# Patient Record
Sex: Male | Born: 1985 | Race: White | Hispanic: No | Marital: Single | Smoking: Current every day smoker
Health system: Southern US, Community
[De-identification: ages and names within clinical notes are randomized; demographics above are authoritative.]

## PROBLEM LIST (undated history)

## (undated) HISTORY — PX: TONSILLECTOMY: SUR1361

---

## 2014-09-11 ENCOUNTER — Ambulatory Visit: Payer: 59

## 2014-09-11 ENCOUNTER — Ambulatory Visit
Admission: EM | Admit: 2014-09-11 | Discharge: 2014-09-11 | Disposition: A | Discharge status: Home / Self Care | Payer: 59 | Attending: Family Medicine | Admitting: Family Medicine

## 2014-09-11 DIAGNOSIS — F17210 Nicotine dependence, cigarettes, uncomplicated: Secondary | ICD-10-CM | POA: Diagnosis not present

## 2014-09-11 DIAGNOSIS — M25571 Pain in right ankle and joints of right foot: Secondary | ICD-10-CM | POA: Insufficient documentation

## 2014-09-11 DIAGNOSIS — S96912A Strain of unspecified muscle and tendon at ankle and foot level, left foot, initial encounter: Secondary | ICD-10-CM | POA: Diagnosis not present

## 2014-09-11 DIAGNOSIS — M778 Other enthesopathies, not elsewhere classified: Secondary | ICD-10-CM

## 2014-09-11 DIAGNOSIS — M779 Enthesopathy, unspecified: Secondary | ICD-10-CM

## 2014-09-11 MED ORDER — MELOXICAM 7.5 MG PO TABS: 7.5000 mg | ORAL_TABLET | Freq: Two times a day (BID) | ORAL | Status: AC

## 2014-09-11 MED ORDER — KETOROLAC TROMETHAMINE 60 MG/2ML IM SOLN
60.0000 mg | Freq: Once | INTRAMUSCULAR | Status: AC
  Administered 2014-09-11: 60 mg via INTRAMUSCULAR

## 2014-09-11 NOTE — ED Notes (Signed)
States right great toe pain x 2 1/2 weeks. Denies trauma. + redness outer toe. Dad recently dx with gout

## 2014-09-11 NOTE — ED Provider Notes (Signed)
CSN: [id number]     Arrival date & time 09/11/14  1600 History   First MD Initiated Contact with Patient 09/11/14 1803     Chief Complaint  Patient presents with  . Toe Pain   (Consider location/radiation/quality/duration/timing/severity/associated sxs/prior Treatment) HPI 2-3 week hx of pain in base of right great toe. 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 Started having deep pain in base of right toe not relieved by 1-2 tabs of OTC ibuprofen 1-2 times a days. Boots are soft and flexible, allowing repeated flexes of area- denies any knowledge of trauma . No recognized stone bruise. Does climb a lot of ladders History reviewed. No pertinent past medical history. Past Surgical History  Procedure Laterality Date  . Tonsillectomy     Family History  Problem Relation Age of Onset  . Cancer Father   . Diabetes Father   . Hypertension Father    History  Substance Use Topics  . Smoking status: Current Every Day Smoker  . Smokeless tobacco: Current User    Types: Chew  . Alcohol Use: Yes     Comment: rarely    Review of Systems  All other systems reviewed and are negative.   Allergies  Review of patient's allergies indicates no known allergies.  Home Medications   Prior to Admission medications   Medication Sig Start Date End Date Taking? Authorizing Provider  ibuprofen (ADVIL,MOTRIN) 200 MG tablet Take 200 mg by mouth every 6 (six) hours as needed.   Yes Historical Provider, MD  meloxicam (MOBIC) 7.5 MG tablet Take 1 tablet (7.5 mg total) by mouth 2 (two) times daily. 09/11/14   Rae HalstedLaurie W Lee, PA-C   BP 124/81 mmHg  Pulse 81  Temp(Src) 98 F (36.7 C) (Tympanic)  Resp 16  Ht 6' (1.829 m)  Wt 220 lb (99.791 kg)  BMI 29.83 kg/m2  SpO2 96% Physical Exam  Constitutional: He appears well-developed and well-nourished.  HENT:  Head: Atraumatic.   Eyes: EOM are normal.  Neck: Neck supple.  Cardiovascular: Normal rate and regular rhythm.   Pulmonary/Chest: No respiratory distress.  Musculoskeletal: He exhibits edema and tenderness.  R great toe is painful to manipulation. No evidence of injury or trauma. PIP is tender to flexion or extension- no evidence of gout - no erythema no heat  Xrays negative for bone abnormality or fracture  Neurological: He is alert.  Skin: Skin is warm and dry.   ED Course  Procedures (including critical care time) Labs Review Labs Reviewed - No data to display   Toradol 60 IM given with significant relief  Imaging Review Dg Toe Great Right  09/11/2014   CLINICAL DATA:  Redness and swelling and pain at the first metatarsal phalangeal joint for 2 weeks.  EXAM: RIGHT GREAT TOE  COMPARISON:  None.  FINDINGS: Osseous structures appear normal. No joint space narrowing. No visible masses.  IMPRESSION: Normal exam.   Electronically Signed   By: Francene BoyersJames  Maxwell M.D.   On: 09/11/2014 17:41      MDM   1. Strain of toe of left foot, initial encounter   2. Toe tendonitis     Toe is buddy taped for support. Discussed firm sole supportive shoes. Ice and elevation after work. Adequate anti-inflammtory coverage. Will start Meloxicam 7.5 mg BID as he admits to not remembering to take mid day medications  even when it hurts.  Given informational handouts on toe strain and turf toe with rehab exercises. RTC in 2 weeks if w/o relief- more quickly as needed. From out of town, no med care established here.   Rae HalstedLaurie W Lee, PA-C 09/11/14 1912  Rae HalstedLaurie W Lee, PA-C 09/12/14 2118

## 2014-10-07 ENCOUNTER — Ambulatory Visit
Admission: EM | Admit: 2014-10-07 | Discharge: 2014-10-07 | Disposition: A | Discharge status: Home / Self Care | Payer: 59 | Attending: Emergency Medicine | Admitting: Emergency Medicine

## 2014-10-07 DIAGNOSIS — H6092 Unspecified otitis externa, left ear: Secondary | ICD-10-CM | POA: Diagnosis not present

## 2014-10-07 MED ORDER — CIPROFLOXACIN-DEXAMETHASONE 0.3-0.1 % OT SUSP: 4.0000 [drp] | Freq: Two times a day (BID) | OTIC | Status: AC

## 2014-10-07 MED ORDER — HYDROCODONE-ACETAMINOPHEN 5-325 MG PO TABS: 1.0000 | ORAL_TABLET | Freq: Four times a day (QID) | ORAL | Status: AC | PRN

## 2014-10-07 MED ORDER — AMOXICILLIN-POT CLAVULANATE 875-125 MG PO TABS: 1.0000 | ORAL_TABLET | Freq: Two times a day (BID) | ORAL | Status: AC

## 2014-10-07 NOTE — ED Notes (Signed)
Patient with left ear pain for the past week, seem to be getting better, tried OTC treatment, past 2 days getting worse.

## 2014-10-07 NOTE — Discharge Instructions (Signed)
Draining Ear °Ear wax, pus, blood and other fluids are examples of the different types of drainage from ears. Drops or cream may be needed to lessen the itching which may occur with ear drainage. °CAUSES  °· Skin irritations in the ear. °· Ear infection. °· Swimmer's ear. °· Ruptured eardrum. °· Foreign object in the ear canal. °· Sudden pressure changes. °· Head injury. °HOME CARE INSTRUCTIONS  °· Only take over-the-counter or prescription medicines for pain, fever, or discomfort as directed by your caregiver. °· Do not rub the ear canal with cotton-tipped swabs. °· Do not swim until your caregiver says it is okay. °· Before you take a shower, cover a cotton ball with petroleum jelly to keep water out. °· Limit exposure to smoke. Secondhand smoke can increase the chance for ear infections. °· Keep up with immunizations. °· Wash your hands well. °· Keep all follow-up appointments to examine the ear and evaluate hearing. °SEEK MEDICAL CARE IF:  °· You have increased drainage. °· You have ear pain, a fever, or drainage that is not getting better after 48 hours of antibiotics. °· You are unusually tired. °SEEK IMMEDIATE MEDICAL CARE IF: °· You have severe ear pain or headache. °· The patient is older than 3 months with a rectal or oral temperature of 102° F (38.9° C) or higher. °· The patient is 3 months old or younger with a rectal temperature of 100.4° F (38° C) or higher. °· You vomit. °· You feel dizzy. °· You have a seizure. °· You have new hearing loss. °MAKE SURE YOU:  °· Understand these instructions. °· Will watch your condition. °· Will get help right away if you are not doing well or get worse. °Document Released: 04/13/2005 Document Revised: 07/06/2011 Document Reviewed: 02/14/2009 °ExitCare® Patient Information ©2015 [employer]. This information is not intended to replace advice given to you by your health care provider. Make sure you discuss any questions you have with your health care  provider. ° °Otitis Externa °Otitis externa is a bacterial or fungal infection of the outer ear canal. This is the area from the eardrum to the outside of the ear. Otitis externa is sometimes called "swimmer's ear." °CAUSES  °Possible causes of infection include: °· Swimming in dirty water. °· Moisture remaining in the ear after swimming or bathing. °· Mild injury (trauma) to the ear. °· Objects stuck in the ear (foreign body). °· Cuts or scrapes (abrasions) on the outside of the ear. °SIGNS AND SYMPTOMS  °The first symptom of infection is often itching in the ear canal. Later signs and symptoms may include swelling and redness of the ear canal, ear pain, and yellowish-white fluid (pus) coming from the ear. The ear pain may be worse when pulling on the earlobe. °DIAGNOSIS  °Your health care provider will perform a physical exam. A sample of fluid may be taken from the ear and examined for bacteria or fungi. °TREATMENT  °Antibiotic ear drops are often given for 10 to 14 days. Treatment may also include pain medicine or corticosteroids to reduce itching and swelling. °HOME CARE INSTRUCTIONS  °· Apply antibiotic ear drops to the ear canal as prescribed by your health care provider. °· Take medicines only as directed by your health care provider. °· If you have diabetes, follow any additional treatment instructions from your health care provider. °· Keep all follow-up visits as directed by your health care provider. °PREVENTION  °· Keep your ear dry. Use the corner of a towel to absorb   water out of the ear canal after swimming or bathing. °· Avoid scratching or putting objects inside your ear. This can damage the ear canal or remove the protective wax that lines the canal. This makes it easier for bacteria and fungi to grow. °· Avoid swimming in lakes, polluted water, or poorly chlorinated pools. °· You may use ear drops made of rubbing alcohol and vinegar after swimming. Combine equal parts of white vinegar and alcohol  in a bottle. Put 3 or 4 drops into each ear after swimming. °SEEK MEDICAL CARE IF:  °· You have a fever. °· Your ear is still red, swollen, painful, or draining pus after 3 days. °· Your redness, swelling, or pain gets worse. °· You have a severe headache. °· You have redness, swelling, pain, or tenderness in the area behind your ear. °MAKE SURE YOU:  °· Understand these instructions. °· Will watch your condition. °· Will get help right away if you are not doing well or get worse. °Document Released: 04/13/2005 Document Revised: 08/28/2013 Document Reviewed: 04/30/2011 °ExitCare® Patient Information ©2015 [employer]. This information is not intended to replace advice given to you by your health care provider. Make sure you discuss any questions you have with your health care provider. ° °

## 2014-10-07 NOTE — ED Provider Notes (Signed)
CSN: [id number]     Arrival date & time 10/07/14  4765 History   None    Chief Complaint  Patient presents with  . Otalgia   (Consider location/radiation/quality/duration/timing/severity/associated sxs/prior Treatment) HPI   This is a 29 year old male who presents with a three-day history of left ear discharge and 2 days of increasing pain. He states that he has not been swimming but has had ear problems as a child having had 4 different Myringotomy tubes placed in his ears in the past. He has not been running a fever or had any chills. He has noticed a decrease in his hearing but denies any tinnitus.  History reviewed. No pertinent past medical history. Past Surgical History  Procedure Laterality Date  . Tonsillectomy     Family History  Problem Relation Age of Onset  . Cancer Father   . Diabetes Father   . Hypertension Father    History  Substance Use Topics  . Smoking status: Current Every Day Smoker  . Smokeless tobacco: Current User    Types: Chew  . Alcohol Use: Yes     Comment: rarely    Review of Systems  HENT: Positive for ear discharge, ear pain and hearing loss.   Eyes: Negative for photophobia, pain, discharge, redness, itching and visual disturbance.  All other systems reviewed and are negative.   Allergies  Review of patient's allergies indicates no known allergies.  Home Medications   Prior to Admission medications   Medication Sig Start Date End Date Taking? Authorizing Provider  amoxicillin-clavulanate (AUGMENTIN) 875-125 MG per tablet Take 1 tablet by mouth every 12 (twelve) hours. 10/07/14   Lutricia Feil, PA-C  ciprofloxacin-dexamethasone (CIPRODEX) otic suspension Place 4 drops into the left ear 2 (two) times daily. 10/07/14   Lutricia Feil, PA-C  HYDROcodone-acetaminophen (NORCO/VICODIN) 5-325 MG per tablet Take 1-2 tablets by mouth every 6 (six) hours as needed for severe pain. 10/07/14   Lutricia Feil, PA-C  ibuprofen (ADVIL,MOTRIN) 200 MG  tablet Take 200 mg by mouth every 6 (six) hours as needed.    Historical Provider, MD  meloxicam (MOBIC) 7.5 MG tablet Take 1 tablet (7.5 mg total) by mouth 2 (two) times daily. 09/11/14   Rae Halsted, PA-C   BP 136/86 mmHg  Pulse 86  Temp(Src) 98.5 F (36.9 C) (Tympanic)  Ht 6' (1.829 m)  Wt 225 lb (102.059 kg)  BMI 30.51 kg/m2  SpO2 98% Physical Exam  Constitutional: He is oriented to person, place, and time. He appears well-developed and well-nourished.  HENT:  Head: Normocephalic and atraumatic.  Right Ear: External ear normal.  Examination of the right ear shows to be normal. Examination of the left ear shows the helix be very red and tender. There is mild crusty discharge at the opening. The canal is markedly swollen and very tender to examination. Movement of the tragus reproduces symptoms and is very painful. There is no erythema or fluctuance or cellulitis of the mastoid or anterior to the ear the TM is not visualized due to the swelling present.  Eyes: EOM are normal. Pupils are equal, round, and reactive to light. Right eye exhibits no discharge. Left eye exhibits no discharge.  Neck: Neck supple.  Neurological: He is alert and oriented to person, place, and time. He has normal reflexes.  Skin: Skin is warm and dry. There is erythema.  Psychiatric: He has a normal mood and affect. His behavior is normal. Judgment and thought content normal.  ED Course  Procedures (including critical care time) Labs Review Labs Reviewed - No data to display  Imaging Review No results found.   MDM   1. Otitis externa of left ear    New Prescriptions   AMOXICILLIN-CLAVULANATE (AUGMENTIN) 875-125 MG PER TABLET    Take 1 tablet by mouth every 12 (twelve) hours.   CIPROFLOXACIN-DEXAMETHASONE (CIPRODEX) OTIC SUSPENSION    Place 4 drops into the left ear 2 (two) times daily.   HYDROCODONE-ACETAMINOPHEN (NORCO/VICODIN) 5-325 MG PER TABLET    Take 1-2 tablets by mouth every 6 (six) hours as  needed for severe pain.  Plan: 1. Diagnosis reviewed with patient 2. rx as per orders; risks, benefits, potential side effects reviewed with patient 3. Recommend supportive treatment with rest warm compresses to outer ear 4. F/u prn if symptoms worsen or don't improve     Lutricia Feil, PA-C 10/07/14 1046

## 2016-02-03 IMAGING — CR DG TOE GREAT 2+V*R*
3 series · 3 of 3 positions shown · non-contrast
Comparison: None.

CLINICAL DATA: Redness and swelling and pain at the first
metatarsal phalangeal joint for 2 weeks.

EXAM:
RIGHT GREAT TOE

[toe ap]
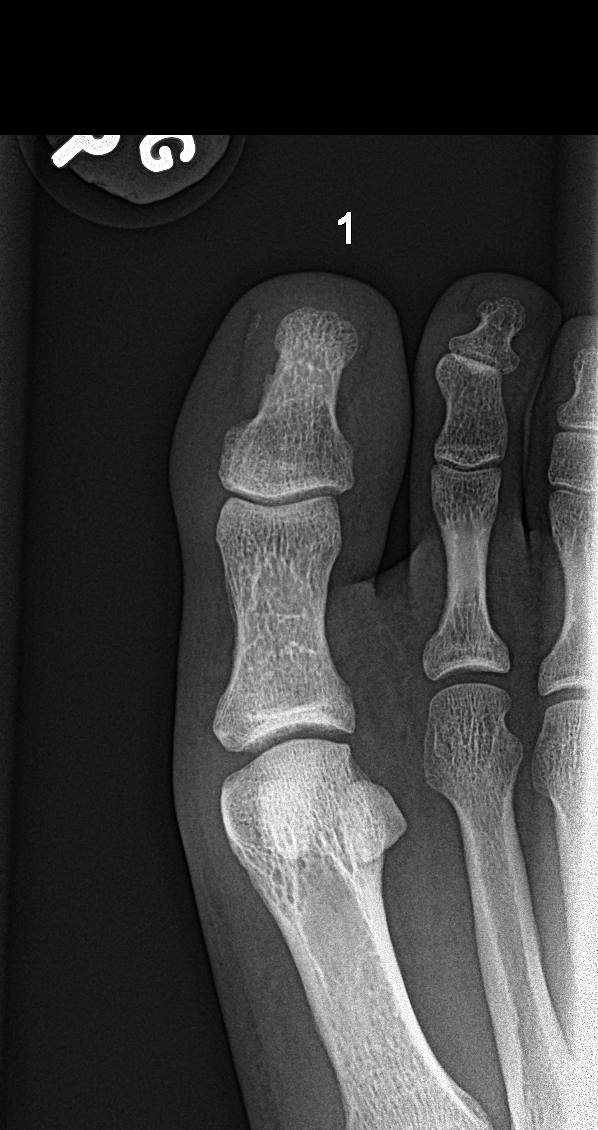

[toe obl]
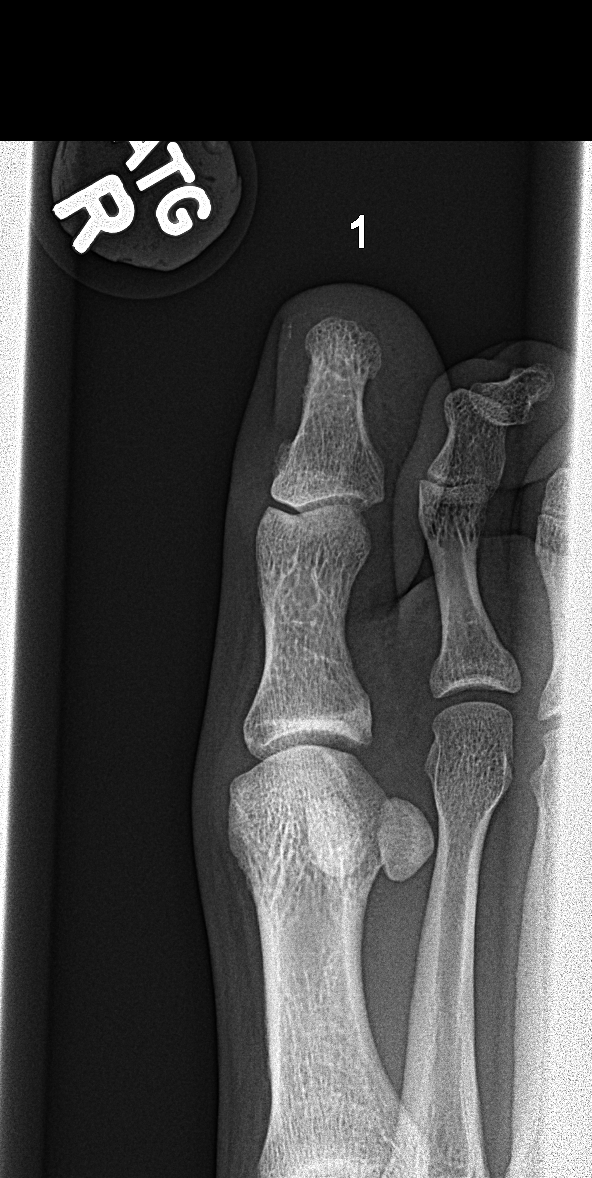

[toe lat]
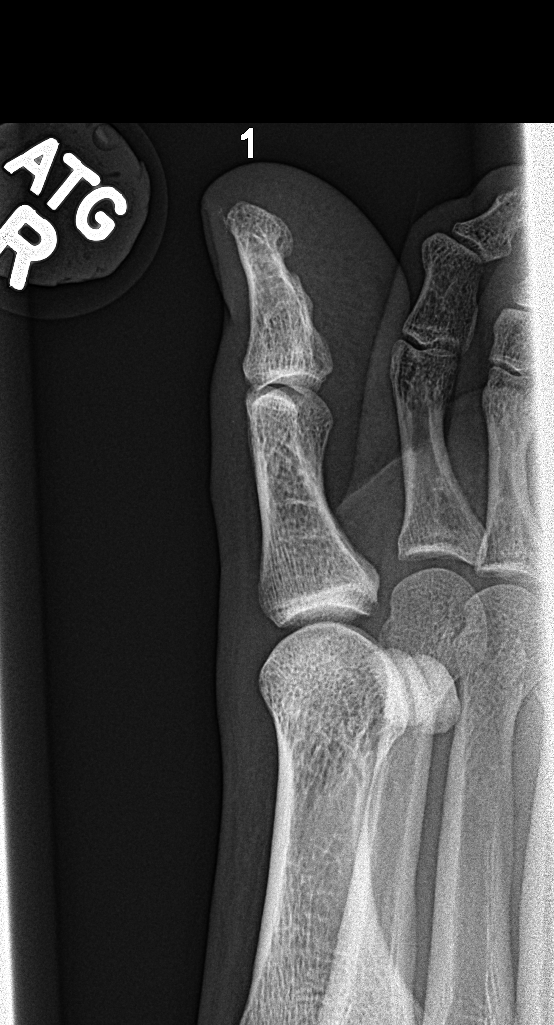

[3 of 3 positions shown; findings below may reference images not displayed]

FINDINGS: Osseous structures appear normal. No joint space narrowing. No
visible masses.
IMPRESSION: Normal exam.
